# Patient Record
Sex: Male | Born: 1995 | Race: White | Hispanic: No | Marital: Single | Smoking: Never smoker
Health system: Southern US, Community
[De-identification: ages and names within clinical notes are randomized; demographics above are authoritative.]

## PROBLEM LIST (undated history)

## (undated) DIAGNOSIS — M549 Dorsalgia, unspecified: Secondary | ICD-10-CM

---
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
## 2019-11-18 ENCOUNTER — Other Ambulatory Visit: Payer: Self-pay

## 2019-11-18 ENCOUNTER — Ambulatory Visit (HOSPITAL_COMMUNITY)
Admission: EM | Admit: 2019-11-18 | Discharge: 2019-11-18 | Disposition: A | Discharge status: Home / Self Care | Payer: BLUE CROSS/BLUE SHIELD

## 2019-11-18 ENCOUNTER — Ambulatory Visit (HOSPITAL_COMMUNITY): Admission: EM | Admit: 2019-11-18 | Payer: Self-pay | Source: Home / Self Care

## 2019-11-18 DIAGNOSIS — F1120 Opioid dependence, uncomplicated: Secondary | ICD-10-CM | POA: Diagnosis present

## 2019-11-18 NOTE — Discharge Instructions (Signed)
Patient may be refer to a outpatient substance abuse services

## 2019-11-18 NOTE — BH Assessment (Signed)
Comprehensive Clinical Assessment (CCA) Note  11/18/2019 Johnny Jordan [id number]  Pt is a 24 year old male who presents to [facility] accompanied by his parents, who did not participate in assessment. Pt says he has an ongoing problem with opioid dependence. He states he heard about Vivitrol and would like to receive an injection to avoid using. Pt states he was discharged from [facility] in [place] approximately one month ago. He says he used once last week. He says he is moving to [facility] next week to live with his brother and avoid drug contacts in the [place] area.  Pt denies depressive symptoms. He denies problems with sleep or appetite. He denies current suicidal ideation or history of suicide attempts. Pt denies any history of intentional self-injurious behaviors. Pt denies current homicidal ideation or history of violence. Pt denies any history of auditory or visual hallucinations. Pt reports he has used alcohol, cocaine and other substances in the past but heroin and Fentanyl are the substances he usually uses.  With Pt's permission, TTS spoke with Pt's parents for collateral information. They says Pt's drug use became severe last year. They says he is psychologically in a much better state after attending treatment. They says he is using drugs much less frequently but also report he overdosed one week ago. They are hopeful Vivitrol will help Pt not use. They report Pt has numerous legal charges and court dates pending. There is no concern for suicidal or homicidal ideation, emphasizing that Pt is not an aggressive person.  Pt is casually dressed, alert and oriented x4. Pt speaks in a clear tone, at moderate volume and normal pace. Motor behavior appears normal. Eye contact is good. Pt's mood is anxious and affect is congruent with mood. Thought process is coherent and relevant. There is no indication Pt is currently responding to internal stimuli or experiencing delusional thought  content. Pt was cooperative throughout assessment.   Visit Diagnosis:   F11.20 Opioid use disorder, Severe   DISPOSITION: Gave clinical report to Gillermo Murdoch, NP who completed MSE and determined Pt does not meet criteria for inpatient psychiatric treatment. She is unable to provide Vivitrol to Pt. Recommendation is for Pt to contact substance abuse treatment programs in [facility] by contacting [facility] at [phone] or the substance abuse treatment access line on his [employer] card.   PHQ9 SCORE ONLY 11/18/2019  PHQ-9 Total Score 0     CCA Screening, Triage and Referral (STR)  Patient Reported Information How did you hear about [place]? Family/Friend  Referral name: No data recorded Referral phone number: No data recorded  Whom do you see for routine medical problems? I don't have a doctor  Practice/Facility Name: No data recorded Practice/Facility Phone Number: No data recorded Name of Contact: No data recorded Contact Number: No data recorded Contact Fax Number: No data recorded Prescriber Name: No data recorded Prescriber Address (if known): No data recorded  What Is the Reason for Your Visit/Call Today? Pt is request Vivitrol injection.  How Long Has This Been Causing You Problems? > than 6 months  What Do You Feel Would Help You the Most Today? Medication;Therapy;Group Therapy   Have You Recently Been in Any Inpatient Treatment (Hospital/Detox/Crisis Center/28-Day Program)? Yes  Name/Location of Program/Hospital:[facility] in [place]  How Long Were You There? 1 month  When Were You Discharged? 10/13/19   Have You Ever Received Services From [employer] Before? No  Who Do You See at [facility]? No data  recorded  Have You Recently Had Any Thoughts About Hurting Yourself? No  Are You Planning to Commit Suicide/Harm Yourself At This time? No   Have you Recently Had Thoughts About Hurting Someone Karolee Ohs? No  Explanation:  No data recorded  Have You Used Any Alcohol or Drugs in the Past 24 Hours? No  How Long Ago Did You Use Drugs or Alcohol? No data recorded What Did You Use and How Much? No data recorded  Do You Currently Have a Therapist/Psychiatrist? No  Name of Therapist/Psychiatrist: No data recorded  Have You Been Recently Discharged From Any Office Practice or Programs? No  Explanation of Discharge From Practice/Program: No data recorded    CCA Screening Triage Referral Assessment Type of Contact: Face-to-Face  Is this Initial or Reassessment? No data recorded Date Telepsych consult ordered in [facility]:  No data recorded Time Telepsych consult ordered in [facility]:  No data recorded  Patient Reported Information Reviewed? Yes  Patient Left Without Being Seen? No data recorded Reason for Not Completing Assessment: No data recorded  Collateral Involvement: Pt parents   Does Patient Have a Court Appointed Legal Guardian? No data recorded Name and Contact of Legal Guardian: No data recorded If Minor and Not Living with Parent(s), Who has Custody? No data recorded Is CPS involved or ever been involved? Never  Is APS involved or ever been involved? Never   Patient Determined To Be At Risk for Harm To Self or Others Based on Review of Patient Reported Information or Presenting Complaint? No  Method: No data recorded Availability of Means: No data recorded Intent: No data recorded Notification Required: No data recorded Additional Information for Danger to Others Potential: No data recorded Additional Comments for Danger to Others Potential: No data recorded Are There Guns or Other Weapons in Your Home? No data recorded Types of Guns/Weapons: No data recorded Are These Weapons Safely Secured?                            No data recorded Who Could Verify You Are Able To Have These Secured: No data recorded Do You Have any Outstanding Charges, Pending Court Dates, Parole/Probation? No data  recorded Contacted To Inform of Risk of Harm To Self or Others: Other: Comment (Not applicable)   Location of Assessment: [facility] Assessment Services   Does Patient Present under Involuntary Commitment? No  IVC Papers Initial File Date: No data recorded  [place] of Residence: Other (Comment) (Moving to [facility])   Patient Currently Receiving the Following Services: Not Receiving Services   Determination of Need: Routine (7 days)   Options For Referral: Intensive Outpatient Therapy;Medication Management     CCA Biopsychosocial  Intake/Chief Complaint:  CCA Intake With Chief Complaint CCA Part Two Date: 11/18/19 CCA Part Two Time: 2030 Chief Complaint/Presenting Problem: Pt reports a history of substance use and is requesting Vivitrol injection. Patient's Currently Reported Symptoms/Problems: Pt reports he last used heroin one week ago. Individual's Strengths: Pt is motivated for treatment Individual's Preferences: NA Individual's Abilities: NA Type of Services Patient Feels Are Needed: Pt would like medication tonight and to start a CDIOP treatment program in [place] next week. Initial Clinical Notes/Concerns: NA  Mental Health Symptoms Depression:  Depression: None  Mania:  Mania: None  Anxiety:   Anxiety: Worrying, Tension  Psychosis:  Psychosis: None  Trauma:  Trauma: None  Obsessions:  Obsessions: None  Compulsions:  Compulsions: None  Inattention:  Inattention: None  Hyperactivity/Impulsivity:  Hyperactivity/Impulsivity: N/A  Oppositional/Defiant Behaviors:  Oppositional/Defiant Behaviors: N/A  Emotional Irregularity:  Emotional Irregularity: N/A  Other Mood/Personality Symptoms:  Other Mood/Personality Symptoms: None   Mental Status Exam Appearance and self-care  Stature:  Stature: Average  Weight:  Weight: Average weight  Clothing:  Clothing: Casual  Grooming:  Grooming: Well-groomed  Cosmetic use:  Cosmetic Use: None  Posture/gait:  Posture/Gait: Normal   Motor activity:  Motor Activity: Not Remarkable  Sensorium  Attention:  Attention: Normal  Concentration:  Concentration: Normal  Orientation:  Orientation: X5  Recall/memory:  Recall/Memory: Normal  Affect and Mood  Affect:  Affect: Anxious  Mood:  Mood: Anxious  Relating  Eye contact:  Eye Contact: Normal  Facial expression:  Facial Expression: Responsive  Attitude toward examiner:  Attitude Toward Examiner: Cooperative  Thought and Language  Speech flow: Speech Flow: Normal  Thought content:  Thought Content: Appropriate to Mood and Circumstances  Preoccupation:  Preoccupations: None  Hallucinations:  Hallucinations: None  Organization:     Company secretary of Knowledge:  Fund of Knowledge: Average  Intelligence:  Intelligence: Average  Abstraction:  Abstraction: Normal  Judgement:  Judgement: Normal  Reality Testing:  Museum/gallery exhibitions officer  Insight:  Insight: Gaps  Decision Making:  Decision Making: Normal  Social Functioning  Social Maturity:  Social Maturity: Impulsive  Social Judgement:  Social Judgement: Normal  Stress  Stressors:  Stressors: Publishing copy Ability:  Coping Ability: Building surveyor Deficits:  Skill Deficits: None  Supports:  Supports: Family     Religion: Religion/Spirituality Are You A Religious Person?: No  Leisure/Recreation: Leisure / Recreation Do You Have Hobbies?: Yes Leisure and Hobbies: Soccer  Exercise/Diet: Exercise/Diet Do You Exercise?: Yes What Type of Exercise Do You Do?: Run/Walk How Many Times a Week Do You Exercise?: 1-3 times a week Have You Gained or Lost A Significant Amount of Weight in the Past Six Months?: No Do You Follow a Special Diet?: No Do You Have Any Trouble Sleeping?: No   CCA Employment/Education  Employment/Work Situation: Employment / Work Situation Employment situation: Employed Where is patient currently 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, fast food job has been  impacted by current illness: Yes Describe how patient's job has been impacted: Missing work Has patient ever been in the [employer]?: No  Education: Education Is Patient Currently Attending School?: No Last Grade Completed: 14 Did Garment/textile technologist From [employer]?: Yes Did Theme park manager?: Yes What Type of College Degree Do you Have?: Associates degree Did You Attend Graduate School?: No Did You Have An Individualized Education Program (IIEP): No Did You Have Any Difficulty At [employer]?: No Patient's Education Has Been Impacted by Current Illness: No   CCA Family/Childhood History  Family and Relationship History: Family history Marital status: Single Does patient have children?: No  Childhood History:  Childhood History By whom was/is the patient raised?: Both parents Description of patient's relationship with caregiver when they were a child: Good Patient's description of current relationship with people who raised him/her: Parents are supportive Does patient have siblings?: Yes Number of Siblings: 1 Description of patient's current relationship with siblings: Brother is supportive Did patient suffer any verbal/emotional/physical/sexual abuse as a child?: No Did patient suffer from severe childhood neglect?: No Has patient ever been sexually abused/assaulted/raped as an adolescent or adult?: No Was the patient ever a victim of a crime or a disaster?: No Witnessed domestic violence?: No Has patient been affected by domestic violence as an adult?: No  Child/Adolescent  Assessment:     CCA Substance Use  Alcohol/Drug Use: Alcohol / Drug Use Pain Medications: Pt abuses opiates Prescriptions: Denies abuse Over the Counter: Denies abuse History of alcohol / drug use?: Yes Longest period of sobriety (when/how long): 1 month Negative Consequences of Use: Financial, Legal, Personal relationships, Work / School Substance #1 Name of Substance 1: Heroin 1 - Age of First Use:  19 1 - Amount (size/oz): Varies 1 - Frequency: Was using daily 1 - Duration: 4 years 1 - Last Use / Amount: 11/11/2019                       ASAM's:  Six Dimensions of Multidimensional Assessment  Dimension 1:  Acute Intoxication and/or Withdrawal Potential:      Dimension 2:  Biomedical Conditions and Complications:      Dimension 3:  Emotional, Behavioral, or Cognitive Conditions and Complications:     Dimension 4:  Readiness to Change:     Dimension 5:  Relapse, Continued use, or Continued Problem Potential:     Dimension 6:  Recovery/Living Environment:     ASAM Severity Score:    ASAM Recommended Level of Treatment:     Substance use Disorder (SUD)    Recommendations for Services/Supports/Treatments:    DSM5 Diagnoses: Patient Active Problem List   Diagnosis Date Noted  . Tinea versicolor 04/21/2012    Patient Centered Plan: Patient is on the following Treatment Plan(s):  Substance Abuse   Referrals to Alternative Service(s): Referred to Alternative Service(s):   Place:   Date:   Time:    Referred to Alternative Service(s):   Place:   Date:   Time:    Referred to Alternative Service(s):   Place:   Date:   Time:    Referred to Alternative Service(s):   Place:   Date:   Time:     Pamalee LeydenFord Ellis Tasheika Kitzmiller Jr, [facility], [facility] Triage Specialist [phone]  Patsy BaltimoreWarrick Jr, Harlin RainFord Ellis

## 2019-11-18 NOTE — ED Provider Notes (Signed)
Behavioral Health Urgent Care Medical Screening Exam  Patient Name: Johnny Jordan MRN: [id number] Date of Evaluation: 11/18/19 Chief Complaint: Chief Complaint/Presenting Problem: Pt reports a history of substance use and is requesting Vivitrol injection. Diagnosis: Opioid use disorder, Severe       History of Present illness: Johnny Jordan is a 24 y.o. male.  With a history of substance abuse since the age of 24 years-old. The patient presented on today's visit requesting a shot of Vivitrol to assist him with his cravings. The patient has an ongoing problem with opioid dependence. He states he heard about Vivitrol and would like to receive an injection to avoid using it. Education provided to the patient that it is a medication that [facility] does not prescribe here. Further educations are provided to the patient about the risk of opioid overdose. You can accidentally overdose on Vivitrol due to the blocking effects of opioids, such as heroin or opioid pain medicines. Do not try to overcome this blocking effect by taking large amounts of opioids--this can lead to serious injury, coma, or death. After receiving a dose of the medication, its blocking effect slowly decreases and completely goes away over time. If you have used opioid street drugs or opioid-containing medicines in the past, using opioids in amounts you used before treatment with VIVITROL can lead to overdose and death. You may also be more sensitive to the effects of lower doses of opioids: The patient discussed with Mr. Davonna Belling TTS; he was discharged from [facility] in [place] approximately one month ago. He says he used it once last week. He says he is moving to [facility] next week to live with his brother and avoid drug contacts in the [place] area. He denies depressive symptoms. He denies problems with sleep or appetite. He denies current suicidal ideation or a history of suicide attempts. The patient denies any  history of intentional self-injurious behaviors. He denies current homicidal ideation or a history of violence. The patient denies any history of auditory or visual hallucinations. The patient reports he has used alcohol, cocaine, and other substances in the past, but heroin and Fentanyl are the substances he usually uses.   Psychiatric Specialty Exam  Presentation  General Appearance:Appropriate for Environment  Eye Contact:Good  Speech:Clear and Coherent  Speech Volume:Normal  Handedness:Right   Mood and Affect  Mood:Euphoric  Affect:Appropriate   Thought Process  Thought Processes:Coherent  Descriptions of Associations:Intact  Orientation:Full (Time, Place and Person)  Thought Content:Logical  Hallucinations:None  Ideas of Reference:None  Suicidal Thoughts:No  Homicidal Thoughts:No   Sensorium  Memory:Recent Good;Immediate Good;Remote Good  Judgment:Good  Insight:Good   Executive Functions  Concentration:Good  Attention Span:Good  Recall:Good  Fund of Knowledge:Good  Language:Good   Psychomotor Activity  Psychomotor Activity:Normal   Assets  Assets:Social Support   Sleep  Sleep:Good  Number of hours: 8   Physical Exam: Physical Exam ROS Blood pressure 130/82, pulse 87, temperature (!) 96.8 F (36 C), resp. rate 18, SpO2 100 %. There is no height or weight on file to calculate BMI.  Musculoskeletal: Strength & Muscle Tone: within normal limits Gait & Station: normal Patient leans: N/A   [facility] MSE Discharge Disposition for Follow up and Recommendations: Based on my evaluation the patient does not appear to have an emergency medical condition and can be discharged with resources and follow up care in outpatient services for Substance Abuse Intensive Outpatient Program   Gillermo Murdoch, NP 11/18/2019, 9:30 PM

## 2023-05-21 ENCOUNTER — Other Ambulatory Visit: Payer: Self-pay

## 2023-05-21 ENCOUNTER — Encounter (HOSPITAL_COMMUNITY): Payer: Self-pay

## 2023-05-21 ENCOUNTER — Emergency Department (HOSPITAL_COMMUNITY)
Admission: EM | Admit: 2023-05-21 | Discharge: 2023-05-21 | Disposition: A | Discharge status: Home / Self Care | Attending: Emergency Medicine | Admitting: Emergency Medicine

## 2023-05-21 DIAGNOSIS — F1490 Cocaine use, unspecified, uncomplicated: Secondary | ICD-10-CM

## 2023-05-21 DIAGNOSIS — F14921 Cocaine use, unspecified with intoxication delirium: Secondary | ICD-10-CM | POA: Insufficient documentation

## 2023-05-21 DIAGNOSIS — R440 Auditory hallucinations: Secondary | ICD-10-CM | POA: Diagnosis present

## 2023-05-21 DIAGNOSIS — R410 Disorientation, unspecified: Secondary | ICD-10-CM

## 2023-05-21 LAB — CBC WITH DIFFERENTIAL/PLATELET
Abs Immature Granulocytes: 0.12 10*3/uL — ABNORMAL HIGH (ref 0.00–0.07)
Basophils Absolute: 0 10*3/uL (ref 0.0–0.1)
Basophils Relative: 0 %
Eosinophils Absolute: 0 10*3/uL (ref 0.0–0.5)
Eosinophils Relative: 0 %
HCT: 36.6 % — ABNORMAL LOW (ref 39.0–52.0)
Hemoglobin: 12.3 g/dL — ABNORMAL LOW (ref 13.0–17.0)
Immature Granulocytes: 1 %
Lymphocytes Relative: 2 %
Lymphs Abs: 0.3 10*3/uL — ABNORMAL LOW (ref 0.7–4.0)
MCH: 29.6 pg (ref 26.0–34.0)
MCHC: 33.6 g/dL (ref 30.0–36.0)
MCV: 88.2 fL (ref 80.0–100.0)
Monocytes Absolute: 1.2 10*3/uL — ABNORMAL HIGH (ref 0.1–1.0)
Monocytes Relative: 8 %
Neutro Abs: 14.1 10*3/uL — ABNORMAL HIGH (ref 1.7–7.7)
Neutrophils Relative %: 89 %
Platelets: 217 10*3/uL (ref 150–400)
RBC: 4.15 MIL/uL — ABNORMAL LOW (ref 4.22–5.81)
RDW: 12.4 % (ref 11.5–15.5)
WBC: 15.8 10*3/uL — ABNORMAL HIGH (ref 4.0–10.5)
nRBC: 0 % (ref 0.0–0.2)

## 2023-05-21 LAB — COMPREHENSIVE METABOLIC PANEL WITH GFR
ALT: 10 U/L (ref 0–44)
AST: 22 U/L (ref 15–41)
Albumin: 3.9 g/dL (ref 3.5–5.0)
Alkaline Phosphatase: 47 U/L (ref 38–126)
Anion gap: 9 (ref 5–15)
BUN: 13 mg/dL (ref 6–20)
CO2: 21 mmol/L — ABNORMAL LOW (ref 22–32)
Calcium: 8.8 mg/dL — ABNORMAL LOW (ref 8.9–10.3)
Chloride: 110 mmol/L (ref 98–111)
Creatinine, Ser: 1.38 mg/dL — ABNORMAL HIGH (ref 0.61–1.24)
GFR, Estimated: >60 mL/min (ref >60)
Glucose, Bld: 185 mg/dL — ABNORMAL HIGH (ref 70–99)
Potassium: 3.7 mmol/L (ref 3.5–5.1)
Sodium: 140 mmol/L (ref 135–145)
Total Bilirubin: 0.6 mg/dL (ref 0.0–1.2)
Total Protein: 6.3 g/dL — ABNORMAL LOW (ref 6.5–8.1)

## 2023-05-21 MED ORDER — SODIUM CHLORIDE 0.9 % IV BOLUS
1000.0000 mL | Freq: Once | INTRAVENOUS | Status: AC
  Administered 2023-05-21: 1000 mL via INTRAVENOUS

## 2023-05-21 NOTE — ED Provider Notes (Signed)
 [place] EMERGENCY DEPARTMENT AT [facility] Provider Note  CSN: [id number] Arrival date & time: 05/21/23 0019  Chief Complaint(s) Drug Overdose  HPI Johnny Jordan is a 28 y.o. male brought in by EMS and [employer] after they were called out to the patient's friend's house for agitation.  Patient admitted to using cocaine tonight and had auditory hallucinations.  He reports feeling very anxious and scared.  Friends tried to calm down but apparently this did not work prompting a call to PD.  During transportation, patient was agitated and required chemical restraints including Haldol and Versed.  Upon arrival, patient was restrained.  GCS of 14.  Now calm.  Has no physical complaints.  Denying any additional drug use.  The history is provided by the patient.    Past Medical History Past Medical History:  Diagnosis Date   Back pain    Patient Active Problem List   Diagnosis Date Noted   Opioid use disorder, severe, dependence (HCC) 11/18/2019   Tinea versicolor 04/21/2012   Home Medication(s) Prior to Admission medications   Not on File                                                                                                                                    Allergies Patient has no known allergies.  Review of Systems Review of Systems As noted in HPI  Physical Exam Vital Signs  I have reviewed the triage vital signs BP 105/62   Pulse 86   Temp 97.9 F (36.6 C) (Oral)   Resp 16   Ht 6' (1.829 m)   Wt 81.6 kg   SpO2 100%   BMI 24.41 kg/m   Physical Exam Constitutional:      General: He is not in acute distress.    Appearance: He is well-developed. He is not diaphoretic.  HENT:     Head: Normocephalic.     Right Ear: External ear normal.     Left Ear: External ear normal.  Eyes:     General: No scleral icterus.       Right eye: No discharge.        Left eye: No discharge.     Conjunctiva/sclera: Conjunctivae normal.     Pupils: Pupils are equal,  round, and reactive to light.  Cardiovascular:     Rate and Rhythm: Regular rhythm. Tachycardia present.     Pulses:          Radial pulses are 2+ on the right side and 2+ on the left side.       Dorsalis pedis pulses are 2+ on the right side and 2+ on the left side.     Heart sounds: Normal heart sounds. No murmur heard.    No friction rub. No gallop.  Pulmonary:     Effort: Pulmonary effort is normal. No respiratory distress.     Breath sounds: Normal breath sounds. No stridor.  Abdominal:     General: There is no distension.     Palpations: Abdomen is soft.     Tenderness: There is no abdominal tenderness.  Musculoskeletal:     Cervical back: Normal range of motion and neck supple. No bony tenderness.     Thoracic back: No bony tenderness.     Lumbar back: No bony tenderness.     Comments: Clavicle stable. Chest stable to AP/Lat compression. Pelvis stable to Lat compression. No obvious extremity deformity. No chest or abdominal wall contusion.  Skin:    General: Skin is warm.     Findings: Abrasion (shins) present.  Neurological:     Mental Status: He is alert and oriented to person, place, and time.     GCS: GCS eye subscore is 4. GCS verbal subscore is 5. GCS motor subscore is 6.     Comments: Moving all extremities      ED Results and Treatments Labs (all labs ordered are listed, but only abnormal results are displayed) Labs Reviewed  CBC WITH DIFFERENTIAL/PLATELET - Abnormal; Notable for the following components:      Result Value   WBC 15.8 (*)    RBC 4.15 (*)    Hemoglobin 12.3 (*)    HCT 36.6 (*)    Neutro Abs 14.1 (*)    Lymphs Abs 0.3 (*)    Monocytes Absolute 1.2 (*)    Abs Immature Granulocytes 0.12 (*)    All other components within normal limits  COMPREHENSIVE METABOLIC PANEL WITH GFR - Abnormal; Notable for the following components:   CO2 21 (*)    Glucose, Bld 185 (*)    Creatinine, Ser 1.38 (*)    Calcium 8.8 (*)    Total Protein 6.3 (*)     All other components within normal limits                                                                                                                         EKG  EKG Interpretation Date/Time:  Friday May 21 2023 00:28:27 EDT Ventricular Rate:  133 PR Interval:  151 QRS Duration:  77 QT Interval:  323 QTC Calculation: 481 R Axis:   62  Text Interpretation: Sinus tachycardia Borderline T abnormalities, inferior leads Borderline prolonged QT interval Confirmed by Drema Pry [phone]) on 05/21/2023 2:46:38 AM       Radiology No results found.  Medications Ordered in ED Medications  sodium chloride 0.9 % bolus 1,000 mL (1,000 mLs Intravenous New Bag/Given 05/21/23 0048)   Procedures .1-3 Lead EKG Interpretation  Performed by: Nira Conn, MD Authorized by: Nira Conn, MD     Interpretation: normal     ECG rate:  90   ECG rate assessment: normal     Rhythm: sinus rhythm     Ectopy: none     Conduction: normal     (including critical care time) Medical Decision Making / ED Course   Medical Decision Making Amount  and/or Complexity of Data Reviewed Labs: ordered. Decision-making details documented in ED Course. ECG/medicine tests: ordered and independent interpretation performed. Decision-making details documented in ED Course.    Patient presents after agitation related to cocaine use.  Patient now calm and cooperative.  Noted to be tachycardic to 120s to 130s.  EKG as above confirmed sinus tachycardia.  Patient was provided with IV fluids and tachycardia resolved.   CBC with leukocytosis likely related to stress reaction from cocaine use.  No electrolyte derangements.  Mild renal insufficiency without AKI.  Patient allowed to metabolize to freedom. Ambulated w/o issue. Family/friend at bedside now.      Final Clinical Impression(s) / ED Diagnoses Final diagnoses:  Cocaine use  Drug-induced delirium   The patient appears reasonably  screened and/or stabilized for discharge and I doubt any other medical condition or other [facility] requiring further screening, evaluation, or treatment in the ED at this time. I have discussed the findings, Dx and Tx plan with the patient/family who expressed understanding and agree(s) with the plan. Discharge instructions discussed at length. The patient/family was given strict return precautions who verbalized understanding of the instructions. No further questions at time of discharge.  Disposition: Discharge  Condition: Good  ED Discharge Orders     None        Follow Up: Primary care provider  Call  to schedule an appointment for close follow up    This chart was dictated using voice recognition software.  Despite best efforts to proofread,  errors can occur which can change the documentation meaning.    Nira Conn, MD 05/21/23 [phone]

## 2023-05-21 NOTE — ED Notes (Signed)
Pt ambulated in the hallway with no assistance

## 2023-05-21 NOTE — ED Triage Notes (Addendum)
 Pt BIB [facility] from friends house. Pt endorses cocaine use tonight and he began having auditory hallucinations. EMS reports a small hematoma on his left shin from PD restraint. Upon EMS arrival pt was in handcuffs and was still agitated. EMS gave 5mg  haldol and 5mg  versed. Upon arrival to ED pt was in 4 point restraints and were taken off once in ED room. GCS 14  EMS Initial HR 180,  1L LR  18 L Bicep.
